# Patient Record
Sex: Male | Born: 1955 | Race: White | Hispanic: No | Marital: Married | Smoking: Former smoker
Health system: Southern US, Community
[De-identification: ages and names within clinical notes are randomized; demographics above are authoritative.]

## PROBLEM LIST (undated history)

## (undated) DIAGNOSIS — E119 Type 2 diabetes mellitus without complications: Secondary | ICD-10-CM

## (undated) DIAGNOSIS — I10 Essential (primary) hypertension: Secondary | ICD-10-CM

## (undated) DIAGNOSIS — E7800 Pure hypercholesterolemia, unspecified: Secondary | ICD-10-CM

## (undated) DIAGNOSIS — I2510 Atherosclerotic heart disease of native coronary artery without angina pectoris: Secondary | ICD-10-CM

## (undated) DIAGNOSIS — F419 Anxiety disorder, unspecified: Secondary | ICD-10-CM

## (undated) HISTORY — PX: LEG SURGERY: SHX1003

## (undated) HISTORY — DX: Anxiety disorder, unspecified: F41.9

## (undated) HISTORY — DX: Atherosclerotic heart disease of native coronary artery without angina pectoris: I25.10

## (undated) HISTORY — DX: Pure hypercholesterolemia, unspecified: E78.00

## (undated) HISTORY — DX: Type 2 diabetes mellitus without complications: E11.9

## (undated) HISTORY — DX: Essential (primary) hypertension: I10

---

## 2001-07-05 ENCOUNTER — Encounter: Payer: Self-pay | Admitting: Family Medicine

## 2001-07-05 ENCOUNTER — Ambulatory Visit (HOSPITAL_COMMUNITY): Admission: RE | Admit: 2001-07-05 | Discharge: 2001-07-05 | Payer: Self-pay | Admitting: Family Medicine

## 2011-04-13 ENCOUNTER — Emergency Department: Payer: Self-pay | Admitting: Unknown Physician Specialty

## 2016-06-23 DIAGNOSIS — R319 Hematuria, unspecified: Secondary | ICD-10-CM | POA: Diagnosis not present

## 2016-06-23 DIAGNOSIS — R32 Unspecified urinary incontinence: Secondary | ICD-10-CM | POA: Diagnosis not present

## 2016-06-23 DIAGNOSIS — R7303 Prediabetes: Secondary | ICD-10-CM | POA: Diagnosis not present

## 2016-06-23 DIAGNOSIS — F1010 Alcohol abuse, uncomplicated: Secondary | ICD-10-CM | POA: Diagnosis not present

## 2016-07-07 DIAGNOSIS — F419 Anxiety disorder, unspecified: Secondary | ICD-10-CM | POA: Diagnosis not present

## 2016-07-07 DIAGNOSIS — Z125 Encounter for screening for malignant neoplasm of prostate: Secondary | ICD-10-CM | POA: Diagnosis not present

## 2016-07-07 DIAGNOSIS — Z23 Encounter for immunization: Secondary | ICD-10-CM | POA: Diagnosis not present

## 2016-07-07 DIAGNOSIS — E785 Hyperlipidemia, unspecified: Secondary | ICD-10-CM | POA: Diagnosis not present

## 2016-07-07 DIAGNOSIS — R7303 Prediabetes: Secondary | ICD-10-CM | POA: Diagnosis not present

## 2016-07-07 DIAGNOSIS — I10 Essential (primary) hypertension: Secondary | ICD-10-CM | POA: Diagnosis not present

## 2016-08-04 DIAGNOSIS — E119 Type 2 diabetes mellitus without complications: Secondary | ICD-10-CM | POA: Diagnosis not present

## 2016-08-04 DIAGNOSIS — R809 Proteinuria, unspecified: Secondary | ICD-10-CM | POA: Diagnosis not present

## 2016-08-04 DIAGNOSIS — Z23 Encounter for immunization: Secondary | ICD-10-CM | POA: Diagnosis not present

## 2016-10-06 DIAGNOSIS — R972 Elevated prostate specific antigen [PSA]: Secondary | ICD-10-CM | POA: Diagnosis not present

## 2016-10-28 DIAGNOSIS — M25511 Pain in right shoulder: Secondary | ICD-10-CM | POA: Diagnosis not present

## 2016-10-31 DIAGNOSIS — M67911 Unspecified disorder of synovium and tendon, right shoulder: Secondary | ICD-10-CM | POA: Diagnosis not present

## 2017-01-05 DIAGNOSIS — E119 Type 2 diabetes mellitus without complications: Secondary | ICD-10-CM | POA: Diagnosis not present

## 2017-01-05 DIAGNOSIS — E785 Hyperlipidemia, unspecified: Secondary | ICD-10-CM | POA: Diagnosis not present

## 2017-01-05 DIAGNOSIS — I10 Essential (primary) hypertension: Secondary | ICD-10-CM | POA: Diagnosis not present

## 2017-01-05 DIAGNOSIS — R809 Proteinuria, unspecified: Secondary | ICD-10-CM | POA: Diagnosis not present

## 2017-06-14 DIAGNOSIS — J209 Acute bronchitis, unspecified: Secondary | ICD-10-CM | POA: Diagnosis not present

## 2017-08-31 DIAGNOSIS — E119 Type 2 diabetes mellitus without complications: Secondary | ICD-10-CM | POA: Diagnosis not present

## 2017-08-31 DIAGNOSIS — I10 Essential (primary) hypertension: Secondary | ICD-10-CM | POA: Diagnosis not present

## 2017-08-31 DIAGNOSIS — E785 Hyperlipidemia, unspecified: Secondary | ICD-10-CM | POA: Diagnosis not present

## 2017-08-31 DIAGNOSIS — M25572 Pain in left ankle and joints of left foot: Secondary | ICD-10-CM | POA: Diagnosis not present

## 2017-09-03 ENCOUNTER — Ambulatory Visit (INDEPENDENT_AMBULATORY_CARE_PROVIDER_SITE_OTHER): Payer: BLUE CROSS/BLUE SHIELD

## 2017-09-03 ENCOUNTER — Ambulatory Visit (INDEPENDENT_AMBULATORY_CARE_PROVIDER_SITE_OTHER): Payer: BLUE CROSS/BLUE SHIELD | Admitting: Orthopedic Surgery

## 2017-09-03 ENCOUNTER — Encounter (INDEPENDENT_AMBULATORY_CARE_PROVIDER_SITE_OTHER): Payer: Self-pay | Admitting: Orthopedic Surgery

## 2017-09-03 DIAGNOSIS — M19172 Post-traumatic osteoarthritis, left ankle and foot: Secondary | ICD-10-CM | POA: Diagnosis not present

## 2017-09-03 DIAGNOSIS — M25572 Pain in left ankle and joints of left foot: Secondary | ICD-10-CM | POA: Diagnosis not present

## 2017-09-03 NOTE — Progress Notes (Signed)
Office Visit Note   Patient: Ian Blankenship           Date of Birth: May 30, 1955           MRN: [id number] Visit Date: 09/03/2017              Requested by: Laurann Montana, MD [phone] [address] [place], [place] [address] PCP: Laurann Montana, MD  Chief Complaint  Patient presents with  . Left Ankle - Pain      HPI: Patient is a 62 year old gentleman with chronic traumatic osteoarthritis left ankle.  Patient states he has had significant symptoms for 2 years worse with walking he states it feels better with wearing a brace.  He uses anti-inflammatories.  Patient states that he has smoked all his life but currently is not smoking.  Patient states he has had multiple traumatic injuries with fractures and soft tissue injuries to the left ankle.  Assessment & Plan: Visit Diagnoses:  1. Pain in left ankle and joints of left foot   2. Post-traumatic osteoarthritis, left ankle and foot     Plan: Discussed with patient his options are a total ankle arthroplasty versus ankle fusion.  With patient having elevated BMI and a high demand person he most likely would do better with a fusion.  Discussed that with a total ankle arthroplasty he would have increased risk of wear and tear of the total ankle and potential for revision to a fusion.  Patient states he would like to consider a fusion at some time discussed that we would proceed with arthroscopic debridement and cannulated screw fixation.  Discussed that he would be office foot for a month and in a fracture boot for 2 months.  Follow-Up Instructions: Return if symptoms worsen or fail to improve.   Ortho Exam  Patient is alert, oriented, no adenopathy, well-dressed, normal affect, normal respiratory effort. Examination patient has an antalgic gait he has a good dorsalis pedis pulse he has multiple wounds around the ankle that have healed.  He has essentially no range of motion of the ankle he does have minimal subtalar motion.   He is tender to palpation anteriorly over the ankle his foot is plantigrade.  Imaging: Xr Ankle Complete Left  Result Date: 09/03/2017 3 view radiographs of the right ankle shows complete collapse of the tibiotalar joint with osteophytic bone spurs.  AP view shows a old Weber C fibular fracture that was treated closed.  There is a congruent subtalar joint.  No images are attached to the encounter.  Labs: No results found for: HGBA1C, ESRSEDRATE, CRP, LABURIC, REPTSTATUS, GRAMSTAIN, CULT, LABORGA  @LABSALLVALUES (HGBA1)@  There is no height or weight on file to calculate BMI.  Orders:  Orders Placed This Encounter  Procedures  . XR Ankle Complete Left   No orders of the defined types were placed in this encounter.    Procedures: No procedures performed  Clinical Data: No additional findings.  ROS:  All other systems negative, except as noted in the HPI. Review of Systems  Objective: Vital Signs: There were no vitals taken for this visit.  Specialty Comments:  No specialty comments available.  PMFS History: Patient Active Problem List   Diagnosis Date Noted  . Post-traumatic osteoarthritis, left ankle and foot 09/03/2017   Past Medical History:  Diagnosis Date  . Anxiety   . High blood pressure   . High cholesterol     Family History  Problem Relation Age of Onset  . Diabetes  Mother     History reviewed. No pertinent surgical history. Social History   Occupational History  . Not on file  Tobacco Use  . Smoking status: Never Smoker  . Smokeless tobacco: Never Used  Substance and Sexual Activity  . Alcohol use: Yes    Alcohol/week: 1.2 - 1.8 oz    Types: 2 - 3 Cans of beer per week    Comment: daily  . Drug use: Never  . Sexual activity: Not on file

## 2017-10-26 DIAGNOSIS — Z1211 Encounter for screening for malignant neoplasm of colon: Secondary | ICD-10-CM | POA: Diagnosis not present

## 2017-10-26 DIAGNOSIS — K635 Polyp of colon: Secondary | ICD-10-CM | POA: Diagnosis not present

## 2017-10-28 DIAGNOSIS — Z1211 Encounter for screening for malignant neoplasm of colon: Secondary | ICD-10-CM | POA: Diagnosis not present

## 2017-10-28 DIAGNOSIS — K635 Polyp of colon: Secondary | ICD-10-CM | POA: Diagnosis not present

## 2017-11-23 DIAGNOSIS — E785 Hyperlipidemia, unspecified: Secondary | ICD-10-CM | POA: Diagnosis not present

## 2018-01-04 DIAGNOSIS — E119 Type 2 diabetes mellitus without complications: Secondary | ICD-10-CM | POA: Diagnosis not present

## 2018-01-04 DIAGNOSIS — E785 Hyperlipidemia, unspecified: Secondary | ICD-10-CM | POA: Diagnosis not present

## 2018-01-04 DIAGNOSIS — I10 Essential (primary) hypertension: Secondary | ICD-10-CM | POA: Diagnosis not present

## 2018-01-04 DIAGNOSIS — Z125 Encounter for screening for malignant neoplasm of prostate: Secondary | ICD-10-CM | POA: Diagnosis not present

## 2018-01-21 DIAGNOSIS — M7021 Olecranon bursitis, right elbow: Secondary | ICD-10-CM | POA: Diagnosis not present

## 2018-07-30 DIAGNOSIS — I10 Essential (primary) hypertension: Secondary | ICD-10-CM | POA: Diagnosis not present

## 2018-07-30 DIAGNOSIS — E1169 Type 2 diabetes mellitus with other specified complication: Secondary | ICD-10-CM | POA: Diagnosis not present

## 2018-07-30 DIAGNOSIS — E782 Mixed hyperlipidemia: Secondary | ICD-10-CM | POA: Diagnosis not present

## 2018-07-30 DIAGNOSIS — R81 Glycosuria: Secondary | ICD-10-CM | POA: Diagnosis not present

## 2018-07-30 DIAGNOSIS — R350 Frequency of micturition: Secondary | ICD-10-CM | POA: Diagnosis not present

## 2018-08-02 DIAGNOSIS — E875 Hyperkalemia: Secondary | ICD-10-CM | POA: Diagnosis not present

## 2018-08-09 DIAGNOSIS — N289 Disorder of kidney and ureter, unspecified: Secondary | ICD-10-CM | POA: Diagnosis not present

## 2018-08-30 DIAGNOSIS — E785 Hyperlipidemia, unspecified: Secondary | ICD-10-CM | POA: Diagnosis not present

## 2018-08-30 DIAGNOSIS — N289 Disorder of kidney and ureter, unspecified: Secondary | ICD-10-CM | POA: Diagnosis not present

## 2018-08-31 DIAGNOSIS — E785 Hyperlipidemia, unspecified: Secondary | ICD-10-CM | POA: Diagnosis not present

## 2018-08-31 DIAGNOSIS — I10 Essential (primary) hypertension: Secondary | ICD-10-CM | POA: Diagnosis not present

## 2018-08-31 DIAGNOSIS — F419 Anxiety disorder, unspecified: Secondary | ICD-10-CM | POA: Diagnosis not present

## 2018-08-31 DIAGNOSIS — E1169 Type 2 diabetes mellitus with other specified complication: Secondary | ICD-10-CM | POA: Diagnosis not present

## 2018-12-01 DIAGNOSIS — E785 Hyperlipidemia, unspecified: Secondary | ICD-10-CM | POA: Diagnosis not present

## 2018-12-01 DIAGNOSIS — I10 Essential (primary) hypertension: Secondary | ICD-10-CM | POA: Diagnosis not present

## 2018-12-01 DIAGNOSIS — E1169 Type 2 diabetes mellitus with other specified complication: Secondary | ICD-10-CM | POA: Diagnosis not present

## 2018-12-01 DIAGNOSIS — F419 Anxiety disorder, unspecified: Secondary | ICD-10-CM | POA: Diagnosis not present

## 2019-03-31 ENCOUNTER — Other Ambulatory Visit: Payer: Self-pay

## 2019-03-31 DIAGNOSIS — Z20822 Contact with and (suspected) exposure to covid-19: Secondary | ICD-10-CM

## 2019-04-01 LAB — NOVEL CORONAVIRUS, NAA: SARS-CoV-2, NAA: NOT DETECTED

## 2019-08-13 DIAGNOSIS — Z23 Encounter for immunization: Secondary | ICD-10-CM | POA: Diagnosis not present

## 2019-09-05 DIAGNOSIS — M5416 Radiculopathy, lumbar region: Secondary | ICD-10-CM | POA: Diagnosis not present

## 2019-09-05 DIAGNOSIS — M545 Low back pain: Secondary | ICD-10-CM | POA: Diagnosis not present

## 2019-09-05 DIAGNOSIS — M62830 Muscle spasm of back: Secondary | ICD-10-CM | POA: Diagnosis not present

## 2019-09-05 DIAGNOSIS — M9903 Segmental and somatic dysfunction of lumbar region: Secondary | ICD-10-CM | POA: Diagnosis not present

## 2019-09-07 DIAGNOSIS — M62830 Muscle spasm of back: Secondary | ICD-10-CM | POA: Diagnosis not present

## 2019-09-07 DIAGNOSIS — M9903 Segmental and somatic dysfunction of lumbar region: Secondary | ICD-10-CM | POA: Diagnosis not present

## 2019-09-07 DIAGNOSIS — M5416 Radiculopathy, lumbar region: Secondary | ICD-10-CM | POA: Diagnosis not present

## 2019-09-07 DIAGNOSIS — M545 Low back pain: Secondary | ICD-10-CM | POA: Diagnosis not present

## 2019-09-09 DIAGNOSIS — M545 Low back pain: Secondary | ICD-10-CM | POA: Diagnosis not present

## 2019-09-09 DIAGNOSIS — M62830 Muscle spasm of back: Secondary | ICD-10-CM | POA: Diagnosis not present

## 2019-09-09 DIAGNOSIS — M5416 Radiculopathy, lumbar region: Secondary | ICD-10-CM | POA: Diagnosis not present

## 2019-09-09 DIAGNOSIS — M9903 Segmental and somatic dysfunction of lumbar region: Secondary | ICD-10-CM | POA: Diagnosis not present

## 2019-09-10 DIAGNOSIS — Z23 Encounter for immunization: Secondary | ICD-10-CM | POA: Diagnosis not present

## 2019-09-12 DIAGNOSIS — I10 Essential (primary) hypertension: Secondary | ICD-10-CM | POA: Diagnosis not present

## 2019-09-12 DIAGNOSIS — M9903 Segmental and somatic dysfunction of lumbar region: Secondary | ICD-10-CM | POA: Diagnosis not present

## 2019-09-12 DIAGNOSIS — M5416 Radiculopathy, lumbar region: Secondary | ICD-10-CM | POA: Diagnosis not present

## 2019-09-12 DIAGNOSIS — E1169 Type 2 diabetes mellitus with other specified complication: Secondary | ICD-10-CM | POA: Diagnosis not present

## 2019-09-12 DIAGNOSIS — Z125 Encounter for screening for malignant neoplasm of prostate: Secondary | ICD-10-CM | POA: Diagnosis not present

## 2019-09-12 DIAGNOSIS — M62830 Muscle spasm of back: Secondary | ICD-10-CM | POA: Diagnosis not present

## 2019-09-12 DIAGNOSIS — M109 Gout, unspecified: Secondary | ICD-10-CM | POA: Diagnosis not present

## 2019-09-12 DIAGNOSIS — E785 Hyperlipidemia, unspecified: Secondary | ICD-10-CM | POA: Diagnosis not present

## 2019-09-12 DIAGNOSIS — M545 Low back pain: Secondary | ICD-10-CM | POA: Diagnosis not present

## 2019-09-14 ENCOUNTER — Other Ambulatory Visit: Payer: Self-pay | Admitting: Family Medicine

## 2019-09-14 DIAGNOSIS — F17201 Nicotine dependence, unspecified, in remission: Secondary | ICD-10-CM

## 2019-09-19 DIAGNOSIS — M9903 Segmental and somatic dysfunction of lumbar region: Secondary | ICD-10-CM | POA: Diagnosis not present

## 2019-09-19 DIAGNOSIS — M545 Low back pain: Secondary | ICD-10-CM | POA: Diagnosis not present

## 2019-09-19 DIAGNOSIS — M5416 Radiculopathy, lumbar region: Secondary | ICD-10-CM | POA: Diagnosis not present

## 2019-09-19 DIAGNOSIS — M62830 Muscle spasm of back: Secondary | ICD-10-CM | POA: Diagnosis not present

## 2019-09-21 DIAGNOSIS — R809 Proteinuria, unspecified: Secondary | ICD-10-CM | POA: Diagnosis not present

## 2019-09-22 DIAGNOSIS — M5416 Radiculopathy, lumbar region: Secondary | ICD-10-CM | POA: Diagnosis not present

## 2019-09-22 DIAGNOSIS — M9903 Segmental and somatic dysfunction of lumbar region: Secondary | ICD-10-CM | POA: Diagnosis not present

## 2019-09-22 DIAGNOSIS — M545 Low back pain: Secondary | ICD-10-CM | POA: Diagnosis not present

## 2019-09-22 DIAGNOSIS — M62830 Muscle spasm of back: Secondary | ICD-10-CM | POA: Diagnosis not present

## 2019-11-21 DIAGNOSIS — R809 Proteinuria, unspecified: Secondary | ICD-10-CM | POA: Diagnosis not present

## 2019-11-21 DIAGNOSIS — I129 Hypertensive chronic kidney disease with stage 1 through stage 4 chronic kidney disease, or unspecified chronic kidney disease: Secondary | ICD-10-CM | POA: Diagnosis not present

## 2019-12-27 ENCOUNTER — Other Ambulatory Visit: Payer: Self-pay | Admitting: Family Medicine

## 2019-12-27 DIAGNOSIS — F17201 Nicotine dependence, unspecified, in remission: Secondary | ICD-10-CM

## 2019-12-29 ENCOUNTER — Ambulatory Visit
Admission: RE | Admit: 2019-12-29 | Discharge: 2019-12-29 | Disposition: A | Discharge status: Home / Self Care | Payer: BC Managed Care – PPO | Source: Ambulatory Visit | Attending: Family Medicine | Admitting: Family Medicine

## 2019-12-29 DIAGNOSIS — F17201 Nicotine dependence, unspecified, in remission: Secondary | ICD-10-CM

## 2019-12-29 DIAGNOSIS — Z87891 Personal history of nicotine dependence: Secondary | ICD-10-CM | POA: Diagnosis not present

## 2020-02-08 ENCOUNTER — Ambulatory Visit: Payer: BC Managed Care – PPO | Admitting: Cardiovascular Disease

## 2020-02-27 NOTE — Progress Notes (Signed)
Cardiology Office Note:   Date:  03/01/2020  NAME:  Ian Blankenship    MRN: [id number] DOB:  1955-09-22   PCP:  Laurann Montana, MD  Cardiologist:  No primary care provider on file.   Referring MD: Laurann Montana, MD   Chief Complaint  Patient presents with  . Coronary Artery Disease   History of Present Illness:   Ian Blankenship is a 64 y.o. male with a hx of CAD, HTN, HLD who is being seen today for the evaluation of CAD at the request of Laurann Montana, MD.  He reports he has no symptoms.  He does not exercise routinely.  He reports he has significant arthritis in his knees.  With his current level of activity which includes walking he has no limitations such as chest pain or shortness of breath or palpitations.  He does have nearly a 30-pack-year history.  He quit smoking about 7 years ago.  Blood pressures well controlled on lisinopril.  His most recent lipid profile shows a total cholesterol 163, HDL 38, LDL 92, triglycerides 190.  He is diabetic on Metformin with a recent A1c of 6.3.  He works in Airline pilot.  His EKG today is normal without any ischemic changes.  No strong family history of heart disease.  He does drink alcohol daily in moderation.  No illicit drug use reported.  He is a father of 2 daughters.  He is married.  He denies any chest pain, shortness of breath or palpitations in office today.  He does report he likely has sleep apnea.  He reports he snores frequently and his fatigue during the day.  Has never had a sleep study.  He is interested in completing a home study.  Problem List 1. CAD -coronary calcification on CT chest lung CA screening -minimal calcifications on my read  2. HTN 3. HLD -total cholesterol 163, HDL 38, LDL 92, triglycerides 190 4. OSA 5.  Diabetes -A1c 6.3  Past Medical History: Past Medical History:  Diagnosis Date  . Anxiety   . Coronary artery disease   . Diabetes mellitus without complication (HCC)   . High blood pressure   . High  cholesterol     Past Surgical History: Past Surgical History:  Procedure Laterality Date  . LEG SURGERY      Current Medications: Current Meds  Medication Sig  . ALPRAZolam (XANAX) 0.5 MG tablet   . atorvastatin (LIPITOR) 80 MG tablet Take 1 tablet (80 mg total) by mouth daily.  . indomethacin (INDOCIN) 50 MG capsule   . lisinopril (PRINIVIL,ZESTRIL) 20 MG tablet   . metFORMIN (GLUCOPHAGE-XR) 500 MG 24 hr tablet SMARTSIG:4 Tablet(s) By Mouth Every Evening  . [DISCONTINUED] atorvastatin (LIPITOR) 40 MG tablet      Allergies:    Patient has no allergy information on record.   Social History: Social History   Socioeconomic History  . Marital status: Married    Spouse name: Not on file  . Number of children: 2  . Years of education: Not on file  . Highest education level: Not on file  Occupational History  . Occupation: Airline pilot   Tobacco Use  . Smoking status: Former Smoker    Packs/day: 1.00    Years: 30.00    Pack years: 30.00  . Smokeless tobacco: Never Used  Substance and Sexual Activity  . Alcohol use: Yes    Alcohol/week: 2.0 - 3.0 standard drinks    Types: 2 - 3 Cans of beer per week  Comment: daily  . Drug use: Never  . Sexual activity: Not on file  Other Topics Concern  . Not on file  Social History Narrative  . Not on file   Social Determinants of Health   Financial Resource Strain:   . Difficulty of Paying Living Expenses: Not on file  Food Insecurity:   . Worried About Programme researcher, broadcasting/film/video in the Last Year: Not on file  . Ran Out of Food in the Last Year: Not on file  Transportation Needs:   . Lack of Transportation (Medical): Not on file  . Lack of Transportation (Non-Medical): Not on file  Physical Activity:   . Days of Exercise per Week: Not on file  . Minutes of Exercise per Session: Not on file  Stress:   . Feeling of Stress : Not on file  Social Connections:   . Frequency of Communication with Friends and Family: Not on file  .  Frequency of Social Gatherings with Friends and Family: Not on file  . Attends Religious Services: Not on file  . Active Member of Clubs or Organizations: Not on file  . Attends Banker Meetings: Not on file  . Marital Status: Not on file     Family History: The patient's family history includes Diabetes in his mother.  ROS:   All other ROS reviewed and negative. Pertinent positives noted in the HPI.     EKGs/Labs/Other Studies Reviewed:   The following studies were personally reviewed by me today:  EKG:  EKG is ordered today.  The ekg ordered today demonstrates normal sinus rhythm, heart rate 85, no acute ischemic changes, no evidence of prior infarction, and was personally reviewed by me.   Recent Labs: No results found for requested labs within last 8760 hours.   Recent Lipid Panel No results found for: CHOL, TRIG, HDL, CHOLHDL, VLDL, LDLCALC, LDLDIRECT  Physical Exam:   VS:  BP 120/71   Pulse 85   Temp (!) 95.7 F (35.4 C)   Ht 5\' 11"  (1.803 m)   Wt 237 lb 3.2 oz (107.6 kg)   SpO2 97%   BMI 33.08 kg/m    Wt Readings from Last 3 Encounters:  03/01/20 237 lb 3.2 oz (107.6 kg)    General: Well nourished, well developed, in no acute distress Heart: Atraumatic, normal size  Eyes: PEERLA, EOMI  Neck: Supple, no JVD Endocrine: No thryomegaly Cardiac: Normal S1, S2; RRR; no murmurs, rubs, or gallops Lungs: Clear to auscultation bilaterally, no wheezing, rhonchi or rales  Abd: Soft, nontender, no hepatomegaly  Ext: No edema, pulses 2+ Musculoskeletal: No deformities, BUE and BLE strength normal and equal Skin: Warm and dry, no rashes   Neuro: Alert and oriented to person, place, time, and situation, CNII-XII grossly intact, no focal deficits  Psych: Normal mood and affect   ASSESSMENT:   [name] is a 64 y.o. male who presents for the following: 1. Coronary artery disease involving native coronary artery of native heart without angina pectoris    2. Primary hypertension   3. Mixed hyperlipidemia   4. Obesity (BMI 30-39.9)   5. Snoring   6. Fatigue, unspecified type     PLAN:   1. Coronary artery disease involving native coronary artery of native heart without angina pectoris 2. Primary hypertension 3. Mixed hyperlipidemia -Minimal coronary calcifications on recent lung cancer screening CT.  No symptoms of angina.  Cardiovascular exam normal.  EKG is normal.  I recommend he take a  baby aspirin.  Of also recommended intensification of LDL cholesterol.  He is diabetic.  He has CAD.  LDL cholesterol goal is less than 70.  I will increase his Lipitor to 80 mg daily.  He will come back in 3 months.  1 week before he will give [place] a fasting lipid profile.  Also need to get his triglycerides less than 150.  I have encouraged him to start exercising and to eat a heart healthy regimen. -Blood pressure well controlled.  A1c 6.3 on Metformin.  4. Obesity (BMI 30-39.9) 5. Snoring 6. Fatigue, unspecified type -He reports he snores and has apneic episodes.  Home sleep study ordered.  Disposition: Return in about 3 months (around 06/01/2020).  Medication Adjustments/Labs and Tests Ordered: Current medicines are reviewed at length with the patient today.  Concerns regarding medicines are outlined above.  Orders Placed This Encounter  Procedures  . Lipid panel  . EKG 12-Lead  . Home sleep test   Meds ordered this encounter  Medications  . atorvastatin (LIPITOR) 80 MG tablet    Sig: Take 1 tablet (80 mg total) by mouth daily.    Dispense:  30 tablet    Refill:  3    Patient Instructions  Medication Instructions:  INCREASED LIPITOR TO 80mg  (1 tablet) daily *If you need a refill on your cardiac medications before your next appointment, please call your pharmacy*  Lab Work: FASTING LIPID PANEL- PLEASE COME HAVE THIS DONE 1 WEEK PRIOR TO NEXT APT If you have labs (blood work) drawn today and your tests are completely normal, you will  receive your results only by: MyChart Message (if you have MyChart) OR . A paper copy in the mail If you have any lab test that is abnormal or we need to change your treatment, we will call you to review the results.  Testing/Procedures: Your physician has recommended that you have a sleep study. This test records several body functions during sleep, including: brain activity, eye movement, oxygen and carbon dioxide blood levels, heart rate and rhythm, breathing rate and rhythm, the flow of air through your mouth and nose, snoring, body muscle movements, and chest and belly movement. SOME ONE WILL REACH OUT TO YOU TO SCHEDULE THIS   Follow-Up: At [facility], you and your health needs are our priority.  As part of our continuing mission to provide you with exceptional heart care, we have created designated Provider Care Teams.  These Care Teams include your primary Cardiologist (physician) and Advanced Practice Providers (APPs -  Physician Assistants and Nurse Practitioners) who all work together to provide you with the care you need, when you need it.  Your next appointment:   3 month(s)  The format for your next appointment:   In Person  Provider:   [facility], MD      Signed, Lennie Odor. Lenna Gilford, MD [facility]  [address] [place], [place] [phone]  03/01/2020 2:46 PM

## 2020-03-01 ENCOUNTER — Other Ambulatory Visit: Payer: Self-pay

## 2020-03-01 ENCOUNTER — Ambulatory Visit (INDEPENDENT_AMBULATORY_CARE_PROVIDER_SITE_OTHER): Payer: BC Managed Care – PPO | Admitting: Cardiovascular Disease

## 2020-03-01 ENCOUNTER — Encounter: Payer: Self-pay | Admitting: Cardiovascular Disease

## 2020-03-01 VITALS — BP 120/71 | HR 85 | Temp 95.7°F | Ht 71.0 in | Wt 237.2 lb

## 2020-03-01 DIAGNOSIS — E782 Mixed hyperlipidemia: Secondary | ICD-10-CM | POA: Diagnosis not present

## 2020-03-01 DIAGNOSIS — E669 Obesity, unspecified: Secondary | ICD-10-CM

## 2020-03-01 DIAGNOSIS — I2510 Atherosclerotic heart disease of native coronary artery without angina pectoris: Secondary | ICD-10-CM

## 2020-03-01 DIAGNOSIS — I10 Essential (primary) hypertension: Secondary | ICD-10-CM

## 2020-03-01 DIAGNOSIS — R5383 Other fatigue: Secondary | ICD-10-CM

## 2020-03-01 DIAGNOSIS — R0683 Snoring: Secondary | ICD-10-CM

## 2020-03-01 MED ORDER — ATORVASTATIN CALCIUM 80 MG PO TABS: 80.0000 mg | ORAL_TABLET | Freq: Every day | ORAL | 3 refills | Status: DC

## 2020-03-01 NOTE — Patient Instructions (Signed)
Medication Instructions:  INCREASED LIPITOR TO 80mg  (1 tablet) daily *If you need a refill on your cardiac medications before your next appointment, please call your pharmacy*  Lab Work: FASTING LIPID PANEL- PLEASE COME HAVE THIS DONE 1 WEEK PRIOR TO NEXT APT If you have labs (blood work) drawn today and your tests are completely normal, you will receive your results only by: MyChart Message (if you have MyChart) OR . A paper copy in the mail If you have any lab test that is abnormal or we need to change your treatment, we will call you to review the results.  Testing/Procedures: Your physician has recommended that you have a sleep study. This test records several body functions during sleep, including: brain activity, eye movement, oxygen and carbon dioxide blood levels, heart rate and rhythm, breathing rate and rhythm, the flow of air through your mouth and nose, snoring, body muscle movements, and chest and belly movement. SOME ONE WILL REACH OUT TO YOU TO SCHEDULE THIS   Follow-Up: At [facility], you and your health needs are our priority.  As part of our continuing mission to provide you with exceptional heart care, we have created designated Provider Care Teams.  These Care Teams include your primary Cardiologist (physician) and Advanced Practice Providers (APPs -  Physician Assistants and Nurse Practitioners) who all work together to provide you with the care you need, when you need it.  Your next appointment:   3 month(s)  The format for your next appointment:   In Person  Provider:   [facility], MD

## 2020-03-29 DIAGNOSIS — M62830 Muscle spasm of back: Secondary | ICD-10-CM | POA: Diagnosis not present

## 2020-03-29 DIAGNOSIS — M9903 Segmental and somatic dysfunction of lumbar region: Secondary | ICD-10-CM | POA: Diagnosis not present

## 2020-03-29 DIAGNOSIS — M545 Low back pain, unspecified: Secondary | ICD-10-CM | POA: Diagnosis not present

## 2020-03-29 DIAGNOSIS — M5416 Radiculopathy, lumbar region: Secondary | ICD-10-CM | POA: Diagnosis not present

## 2020-04-02 DIAGNOSIS — Z23 Encounter for immunization: Secondary | ICD-10-CM | POA: Diagnosis not present

## 2020-04-02 DIAGNOSIS — R809 Proteinuria, unspecified: Secondary | ICD-10-CM | POA: Diagnosis not present

## 2020-04-02 DIAGNOSIS — I129 Hypertensive chronic kidney disease with stage 1 through stage 4 chronic kidney disease, or unspecified chronic kidney disease: Secondary | ICD-10-CM | POA: Diagnosis not present

## 2020-04-16 DIAGNOSIS — M9903 Segmental and somatic dysfunction of lumbar region: Secondary | ICD-10-CM | POA: Diagnosis not present

## 2020-04-16 DIAGNOSIS — M62830 Muscle spasm of back: Secondary | ICD-10-CM | POA: Diagnosis not present

## 2020-04-16 DIAGNOSIS — M545 Low back pain, unspecified: Secondary | ICD-10-CM | POA: Diagnosis not present

## 2020-04-16 DIAGNOSIS — M5416 Radiculopathy, lumbar region: Secondary | ICD-10-CM | POA: Diagnosis not present

## 2020-06-03 NOTE — Progress Notes (Signed)
Cardiology Office Note:   Date:  06/04/2020  NAME:  JACOBY ZANNI    MRN: [id number] DOB:  1955/06/12   PCP:  Laurann Montana, MD  Cardiologist:  No primary care provider on file.   Referring MD: Laurann Montana, MD   Chief Complaint  Patient presents with   Coronary Artery Disease   History of Present Illness:   CHEY CHO is a 65 y.o. male with a hx of CAD (coronary calcifications on lung CA screening CT), HTN, HLD who presents for follow-up. Plan to repeat lipid profile.  He reports he is doing well.  No structured exercise due to arthritis in his knees.  He was instructed to stop taking indomethacin.  Likely okay to take periodically.  Blood pressure 125/74.  He is on lisinopril.  Most recent diabetes number acceptable.  He is due for repeat labs.  He has been on Lipitor 80 mg.  We do need to recheck his lipid profile today.  He denies any chest pain, shortness of breath or palpitations today.  He is without any major symptoms.  Overall doing well.  Just need to make sure his cholesterol level is at goal.  Problem List 1. CAD -coronary calcification on CT chest lung CA screening -minimal calcifications on my read  2. HTN 3. HLD -total cholesterol 163, HDL 38, LDL 92, triglycerides 190 4. OSA 5.  Diabetes -A1c 6.3  Past Medical History: Past Medical History:  Diagnosis Date   Anxiety    Coronary artery disease    Diabetes mellitus without complication (HCC)    High blood pressure    High cholesterol     Past Surgical History: Past Surgical History:  Procedure Laterality Date   LEG SURGERY      Current Medications: Current Meds  Medication Sig   ALPRAZolam (XANAX) 0.5 MG tablet    lisinopril (PRINIVIL,ZESTRIL) 20 MG tablet    metFORMIN (GLUCOPHAGE-XR) 500 MG 24 hr tablet SMARTSIG:4 Tablet(s) By Mouth Every Evening   [DISCONTINUED] atorvastatin (LIPITOR) 80 MG tablet Take 1 tablet (80 mg total) by mouth daily.   [DISCONTINUED] indomethacin  (INDOCIN) 50 MG capsule      Allergies:    Patient has no allergy information on record.   Social History: Social History   Socioeconomic History   Marital status: Married    Spouse name: Not on file   Number of children: 2   Years of education: Not on file   Highest education level: Not on file  Occupational History   Occupation: sales   Tobacco Use   Smoking status: Former Smoker    Packs/day: 1.00    Years: 30.00    Pack years: 30.00   Smokeless tobacco: Never Used  Substance and Sexual Activity   Alcohol use: Yes    Alcohol/week: 2.0 - 3.0 standard drinks    Types: 2 - 3 Cans of beer per week    Comment: daily   Drug use: Never   Sexual activity: Not on file  Other Topics Concern   Not on file  Social History Narrative   Not on file   Social Determinants of Health   Financial Resource Strain: Not on file  Food Insecurity: Not on file  Transportation Needs: Not on file  Physical Activity: Not on file  Stress: Not on file  Social Connections: Not on file     Family History: The patient's family history includes Diabetes in his mother.  ROS:   All other ROS reviewed and  negative. Pertinent positives noted in the HPI.     EKGs/Labs/Other Studies Reviewed:   The following studies were personally reviewed by me today:   Recent Labs: No results found for requested labs within last 8760 hours.   Recent Lipid Panel No results found for: CHOL, TRIG, HDL, CHOLHDL, VLDL, LDLCALC, LDLDIRECT  Physical Exam:   VS:  BP 125/74    Pulse 71    Temp (!) 97.3 F (36.3 C)    Ht 5\' 10"  (1.778 m)    Wt 236 lb (107 kg)    SpO2 98%    BMI 33.86 kg/m    Wt Readings from Last 3 Encounters:  06/04/20 236 lb (107 kg)  03/01/20 237 lb 3.2 oz (107.6 kg)    General: Well nourished, well developed, in no acute distress Head: Atraumatic, normal size  Eyes: PEERLA, EOMI  Neck: Supple, no JVD Endocrine: No thryomegaly Cardiac: Normal S1, S2; RRR; no murmurs, rubs,  or gallops Lungs: Clear to auscultation bilaterally, no wheezing, rhonchi or rales  Abd: Soft, nontender, no hepatomegaly  Ext: No edema, pulses 2+ Musculoskeletal: No deformities, BUE and BLE strength normal and equal Skin: Warm and dry, no rashes   Neuro: Alert and oriented to person, place, time, and situation, CNII-XII grossly intact, no focal deficits  Psych: Normal mood and affect   ASSESSMENT:   NARESH ALTHAUS is a 65 y.o. male who presents for the following: 1. Coronary artery disease involving native coronary artery of native heart without angina pectoris   2. Mixed hyperlipidemia   3. Primary hypertension   4. Obesity (BMI 30-39.9)   5. Arthritis   6. Type 2 diabetes mellitus with complication, without long-term current use of insulin (HCC)     PLAN:   1. Coronary artery disease involving native coronary artery of native heart without angina pectoris 2. Mixed hyperlipidemia -Minimal coronary calcification seen on lung cancer screening CT.  Continue Lipitor 80 mg a day.  Repeat lipid profile today.  Goal LDL cholesterol less than 70.  I would not worry about aspirin for now.  He has minimal CAD.  3. Primary hypertension -BP acceptable.  Continue lisinopril.  4. Obesity (BMI 30-39.9) -Diet and exercise recommended.  5. Arthritis -We will give him a prescription for indomethacin.  He will take this periodically.  30 tablets no refills.  6. Type 2 diabetes mellitus with complication, without long-term current use of insulin (HCC) -Recheck A1c.  Disposition: Return in about 1 year (around 06/04/2021).  Medication Adjustments/Labs and Tests Ordered: Current medicines are reviewed at length with the patient today.  Concerns regarding medicines are outlined above.  Orders Placed This Encounter  Procedures   CBC   Basic metabolic panel   Hemoglobin A1c   Lipid panel   Meds ordered this encounter  Medications   indomethacin (INDOCIN) 50 MG capsule    Sig: Take  1 capsule (50 mg total) by mouth 2 (two) times daily with a meal.    Dispense:  30 capsule    Refill:  0    Future refills, if needed, from PCP   atorvastatin (LIPITOR) 80 MG tablet    Sig: Take 1 tablet (80 mg total) by mouth daily.    Dispense:  90 tablet    Refill:  3    Patient Instructions  Medication Instructions:  Your physician recommends that you continue on your current medications as directed. Please refer to the Current Medication list given to you today.  *If you  need a refill on your cardiac medications before your next appointment, please call your pharmacy*   Lab Work: Lipid Panel, A1c, BMET, CBC If you have labs (blood work) drawn today and your tests are completely normal, you will receive your results only by:  MyChart Message (if you have MyChart) OR  A paper copy in the mail If you have any lab test that is abnormal or we need to change your treatment, we will call you to review the results.   Testing/Procedures: NONE   Follow-Up: At [facility], you and your health needs are our priority.  As part of our continuing mission to provide you with exceptional heart care, we have created designated Provider Care Teams.  These Care Teams include your primary Cardiologist (physician) and Advanced Practice Providers (APPs -  Physician Assistants and Nurse Practitioners) who all work together to provide you with the care you need, when you need it.  We recommend signing up for the patient portal called "MyChart".  Sign up information is provided on this After Visit Summary.  MyChart is used to connect with patients for Virtual Visits (Telemedicine).  Patients are able to view lab/test results, encounter notes, upcoming appointments, etc.  Non-urgent messages can be sent to your provider as well.   To learn more about what you can do with MyChart, go to [email or web address].    Your next appointment:   12 month(s)  The format for your next appointment:   In  Person  Provider:   You may see Dr. Flora Lipps or one of the following Advanced Practice Providers on your designated Care Team:    Azalee Course, PA-C  Micah Flesher, [place] or   Judy Pimple, [place]    Other Instructions      Time Spent with Patient: I have spent a total of 35 minutes with patient reviewing hospital notes, telemetry, EKGs, labs and examining the patient as well as establishing an assessment and plan that was discussed with the patient.  > 50% of time was spent in direct patient care.  Signed, Lenna Gilford. Flora Lipps, MD [facility]  [address], [address] [place], [place] [address] [phone]  06/04/2020 1:36 PM

## 2020-06-04 ENCOUNTER — Other Ambulatory Visit: Payer: Self-pay

## 2020-06-04 ENCOUNTER — Encounter: Payer: Self-pay | Admitting: Cardiovascular Disease

## 2020-06-04 ENCOUNTER — Ambulatory Visit (INDEPENDENT_AMBULATORY_CARE_PROVIDER_SITE_OTHER): Payer: BC Managed Care – PPO | Admitting: Cardiovascular Disease

## 2020-06-04 VITALS — BP 125/74 | HR 71 | Temp 97.3°F | Ht 70.0 in | Wt 236.0 lb

## 2020-06-04 DIAGNOSIS — E669 Obesity, unspecified: Secondary | ICD-10-CM

## 2020-06-04 DIAGNOSIS — E782 Mixed hyperlipidemia: Secondary | ICD-10-CM | POA: Diagnosis not present

## 2020-06-04 DIAGNOSIS — I10 Essential (primary) hypertension: Secondary | ICD-10-CM | POA: Diagnosis not present

## 2020-06-04 DIAGNOSIS — I2510 Atherosclerotic heart disease of native coronary artery without angina pectoris: Secondary | ICD-10-CM

## 2020-06-04 DIAGNOSIS — M1990 Unspecified osteoarthritis, unspecified site: Secondary | ICD-10-CM

## 2020-06-04 DIAGNOSIS — E118 Type 2 diabetes mellitus with unspecified complications: Secondary | ICD-10-CM

## 2020-06-04 MED ORDER — INDOMETHACIN 50 MG PO CAPS: 50.0000 mg | ORAL_CAPSULE | Freq: Two times a day (BID) | ORAL | 0 refills | Status: AC

## 2020-06-04 MED ORDER — ATORVASTATIN CALCIUM 80 MG PO TABS: 80.0000 mg | ORAL_TABLET | Freq: Every day | ORAL | 3 refills | Status: DC

## 2020-06-04 NOTE — Patient Instructions (Signed)
Medication Instructions:  Your physician recommends that you continue on your current medications as directed. Please refer to the Current Medication list given to you today.  *If you need a refill on your cardiac medications before your next appointment, please call your pharmacy*   Lab Work: Lipid Panel, A1c, BMET, CBC If you have labs (blood work) drawn today and your tests are completely normal, you will receive your results only by: Marland Kitchen MyChart Message (if you have MyChart) OR . A paper copy in the mail If you have any lab test that is abnormal or we need to change your treatment, we will call you to review the results.   Testing/Procedures: NONE   Follow-Up: At [facility], you and your health needs are our priority.  As part of our continuing mission to provide you with exceptional heart care, we have created designated Provider Care Teams.  These Care Teams include your primary Cardiologist (physician) and Advanced Practice Providers (APPs -  Physician Assistants and Nurse Practitioners) who all work together to provide you with the care you need, when you need it.  We recommend signing up for the patient portal called "MyChart".  Sign up information is provided on this After Visit Summary.  MyChart is used to connect with patients for Virtual Visits (Telemedicine).  Patients are able to view lab/test results, encounter notes, upcoming appointments, etc.  Non-urgent messages can be sent to your provider as well.   To learn more about what you can do with MyChart, go to [email or web address].    Your next appointment:   12 month(s)  The format for your next appointment:   In Person  Provider:   You may see Dr. Flora Lipps or one of the following Advanced Practice Providers on your designated Care Team:    Azalee Course, PA-C  Micah Flesher, [place] or   Judy Pimple, [place]    Other Instructions

## 2020-06-05 LAB — LIPID PANEL
Chol/HDL Ratio: 5.4 ratio — ABNORMAL HIGH (ref 0.0–5.0)
Cholesterol, Total: 168 mg/dL (ref 100–199)
HDL: 31 mg/dL — ABNORMAL LOW (ref >39)
LDL Chol Calc (NIH): 92 mg/dL (ref 0–99)
Triglycerides: 264 mg/dL — ABNORMAL HIGH (ref 0–149)
VLDL Cholesterol Cal: 45 mg/dL — ABNORMAL HIGH (ref 5–40)

## 2020-06-05 LAB — BASIC METABOLIC PANEL
BUN/Creatinine Ratio: 16 (ref 10–24)
BUN: 18 mg/dL (ref 8–27)
CO2: 24 mmol/L (ref 20–29)
Calcium: 9.5 mg/dL (ref 8.6–10.2)
Chloride: 101 mmol/L (ref 96–106)
Creatinine, Ser: 1.13 mg/dL (ref 0.76–1.27)
GFR calc Af Amer: 78 mL/min/{1.73_m2} (ref >59)
GFR calc non Af Amer: 68 mL/min/{1.73_m2} (ref >59)
Glucose: 114 mg/dL — ABNORMAL HIGH (ref 65–99)
Potassium: 5.1 mmol/L (ref 3.5–5.2)
Sodium: 138 mmol/L (ref 134–144)

## 2020-06-05 LAB — CBC
Hematocrit: 43 % (ref 37.5–51.0)
Hemoglobin: 14.3 g/dL (ref 13.0–17.7)
MCH: 31.7 pg (ref 26.6–33.0)
MCHC: 33.3 g/dL (ref 31.5–35.7)
MCV: 95 fL (ref 79–97)
Platelets: 165 10*3/uL (ref 150–450)
RBC: 4.51 x10E6/uL (ref 4.14–5.80)
RDW: 12.6 % (ref 11.6–15.4)
WBC: 6.5 10*3/uL (ref 3.4–10.8)

## 2020-06-05 LAB — HEMOGLOBIN A1C
Est. average glucose Bld gHb Est-mCnc: 128 mg/dL
Hgb A1c MFr Bld: 6.1 % — ABNORMAL HIGH (ref 4.8–5.6)

## 2020-06-07 ENCOUNTER — Other Ambulatory Visit: Payer: Self-pay

## 2020-06-07 MED ORDER — EZETIMIBE 10 MG PO TABS
10.0000 mg | ORAL_TABLET | Freq: Every day | ORAL | 3 refills | Status: DC
Start: 2020-06-07 — End: 2021-06-25

## 2020-08-02 ENCOUNTER — Other Ambulatory Visit: Payer: Self-pay | Admitting: Cardiovascular Disease

## 2020-09-12 ENCOUNTER — Telehealth: Payer: Self-pay

## 2020-09-12 NOTE — Telephone Encounter (Signed)
Can you please assist with this PA request for Zetia.    Key: [id number] Name: Murdoch DOB: 05/20/1956  Thank you!

## 2020-09-12 NOTE — Telephone Encounter (Signed)
**Note De-Identified Chanel Mcadams Obfuscation** I started a Ezetimibe PA through covermymeds and received the following message: Aubrey Voong Key: [id number] - PA Case ID: [id number] - Rx #: [id number] Outcome Approved today Coverage Starts on: 09/12/2020 12:00:00 AM, Coverage Ends on: 09/12/2021 12:00:00 AM. Drug: Ezetimibe 10MG  tablets Form: PA Form (2017 NCPDP)  I have notified [facility] of this approval Alexzavier Girardin VM as well.

## 2020-09-21 DIAGNOSIS — R809 Proteinuria, unspecified: Secondary | ICD-10-CM | POA: Diagnosis not present

## 2020-09-21 DIAGNOSIS — I10 Essential (primary) hypertension: Secondary | ICD-10-CM | POA: Diagnosis not present

## 2020-09-21 DIAGNOSIS — Z23 Encounter for immunization: Secondary | ICD-10-CM | POA: Diagnosis not present

## 2020-09-21 DIAGNOSIS — Z125 Encounter for screening for malignant neoplasm of prostate: Secondary | ICD-10-CM | POA: Diagnosis not present

## 2020-09-21 DIAGNOSIS — E1169 Type 2 diabetes mellitus with other specified complication: Secondary | ICD-10-CM | POA: Diagnosis not present

## 2020-09-21 DIAGNOSIS — E785 Hyperlipidemia, unspecified: Secondary | ICD-10-CM | POA: Diagnosis not present

## 2020-09-28 DIAGNOSIS — M170 Bilateral primary osteoarthritis of knee: Secondary | ICD-10-CM | POA: Diagnosis not present

## 2020-10-17 DIAGNOSIS — M545 Low back pain, unspecified: Secondary | ICD-10-CM | POA: Diagnosis not present

## 2020-10-17 DIAGNOSIS — M5416 Radiculopathy, lumbar region: Secondary | ICD-10-CM | POA: Diagnosis not present

## 2020-10-17 DIAGNOSIS — M9903 Segmental and somatic dysfunction of lumbar region: Secondary | ICD-10-CM | POA: Diagnosis not present

## 2020-10-17 DIAGNOSIS — M62830 Muscle spasm of back: Secondary | ICD-10-CM | POA: Diagnosis not present

## 2020-10-19 DIAGNOSIS — I129 Hypertensive chronic kidney disease with stage 1 through stage 4 chronic kidney disease, or unspecified chronic kidney disease: Secondary | ICD-10-CM | POA: Diagnosis not present

## 2020-10-19 DIAGNOSIS — M62830 Muscle spasm of back: Secondary | ICD-10-CM | POA: Diagnosis not present

## 2020-10-19 DIAGNOSIS — M545 Low back pain, unspecified: Secondary | ICD-10-CM | POA: Diagnosis not present

## 2020-10-19 DIAGNOSIS — M5416 Radiculopathy, lumbar region: Secondary | ICD-10-CM | POA: Diagnosis not present

## 2020-10-19 DIAGNOSIS — R809 Proteinuria, unspecified: Secondary | ICD-10-CM | POA: Diagnosis not present

## 2020-10-19 DIAGNOSIS — M9903 Segmental and somatic dysfunction of lumbar region: Secondary | ICD-10-CM | POA: Diagnosis not present

## 2020-10-23 DIAGNOSIS — M9903 Segmental and somatic dysfunction of lumbar region: Secondary | ICD-10-CM | POA: Diagnosis not present

## 2020-10-23 DIAGNOSIS — M5416 Radiculopathy, lumbar region: Secondary | ICD-10-CM | POA: Diagnosis not present

## 2020-10-23 DIAGNOSIS — M545 Low back pain, unspecified: Secondary | ICD-10-CM | POA: Diagnosis not present

## 2020-10-23 DIAGNOSIS — M62830 Muscle spasm of back: Secondary | ICD-10-CM | POA: Diagnosis not present

## 2020-10-26 DIAGNOSIS — M62830 Muscle spasm of back: Secondary | ICD-10-CM | POA: Diagnosis not present

## 2020-10-26 DIAGNOSIS — M9903 Segmental and somatic dysfunction of lumbar region: Secondary | ICD-10-CM | POA: Diagnosis not present

## 2020-10-26 DIAGNOSIS — M5416 Radiculopathy, lumbar region: Secondary | ICD-10-CM | POA: Diagnosis not present

## 2020-10-26 DIAGNOSIS — M545 Low back pain, unspecified: Secondary | ICD-10-CM | POA: Diagnosis not present

## 2020-10-29 DIAGNOSIS — M545 Low back pain, unspecified: Secondary | ICD-10-CM | POA: Diagnosis not present

## 2020-10-29 DIAGNOSIS — M9903 Segmental and somatic dysfunction of lumbar region: Secondary | ICD-10-CM | POA: Diagnosis not present

## 2020-10-29 DIAGNOSIS — M62830 Muscle spasm of back: Secondary | ICD-10-CM | POA: Diagnosis not present

## 2020-10-29 DIAGNOSIS — M5416 Radiculopathy, lumbar region: Secondary | ICD-10-CM | POA: Diagnosis not present

## 2020-11-01 DIAGNOSIS — M9903 Segmental and somatic dysfunction of lumbar region: Secondary | ICD-10-CM | POA: Diagnosis not present

## 2020-11-01 DIAGNOSIS — M62830 Muscle spasm of back: Secondary | ICD-10-CM | POA: Diagnosis not present

## 2020-11-01 DIAGNOSIS — M5416 Radiculopathy, lumbar region: Secondary | ICD-10-CM | POA: Diagnosis not present

## 2020-11-01 DIAGNOSIS — M545 Low back pain, unspecified: Secondary | ICD-10-CM | POA: Diagnosis not present

## 2020-11-14 DIAGNOSIS — M9903 Segmental and somatic dysfunction of lumbar region: Secondary | ICD-10-CM | POA: Diagnosis not present

## 2020-11-14 DIAGNOSIS — M5416 Radiculopathy, lumbar region: Secondary | ICD-10-CM | POA: Diagnosis not present

## 2020-11-14 DIAGNOSIS — M545 Low back pain, unspecified: Secondary | ICD-10-CM | POA: Diagnosis not present

## 2020-11-14 DIAGNOSIS — M62830 Muscle spasm of back: Secondary | ICD-10-CM | POA: Diagnosis not present

## 2020-11-27 IMAGING — CT CT CHEST LUNG CANCER SCREENING LOW DOSE W/O CM
2 of 5 series · 15 of 40 positions shown, 18 images · non-contrast
Comparison: None.

CLINICAL DATA: 63-year-old asymptomatic male former smoker with 30
pack-year smoking history, quit smoking 7 years prior.

EXAM:
CT CHEST WITHOUT CONTRAST LOW-DOSE FOR LUNG CANCER SCREENING
TECHNIQUE: Multidetector CT imaging of the chest was performed following the
standard protocol without IV contrast.

[Series 4: lung 1.00 br44 cor · coronal · 0.65mm/px · 3 of 346 slices shown]
[im 70/346  lung]
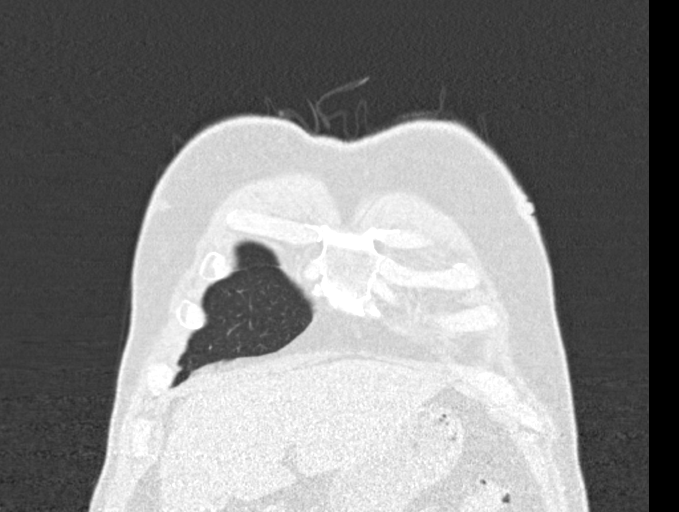
[im 139/346  lung]
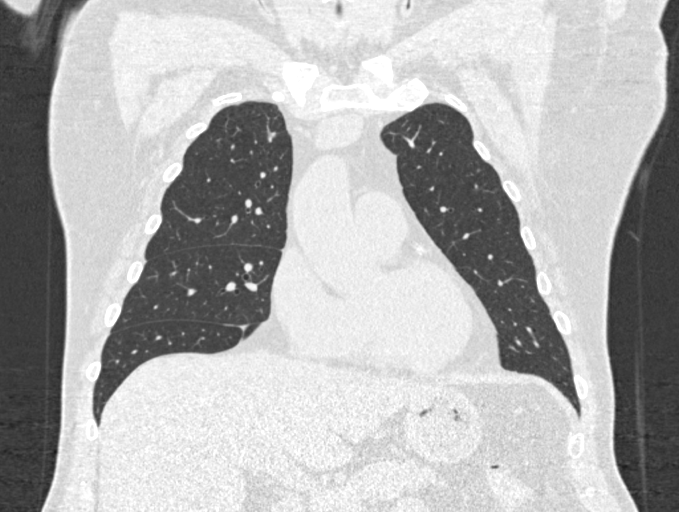
[im 208/346  lung]
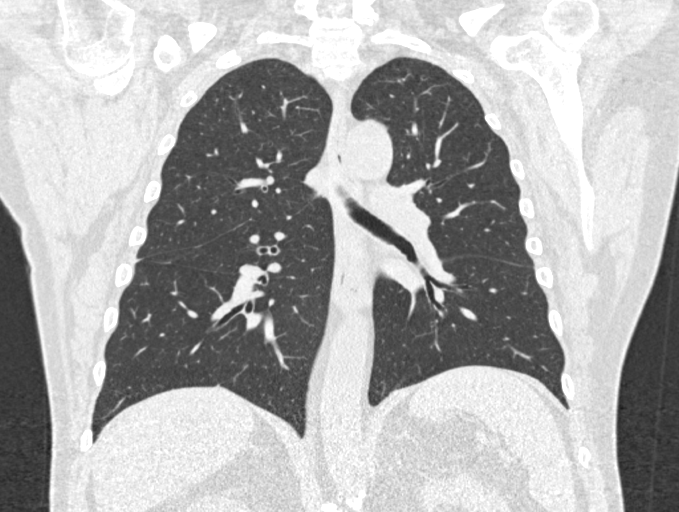

[Series 9: lung 1.00 br60 axial · axial · 0.86mm/px · z∈[-1092,-792]mm · 12 of 332 slices shown, 15 images]
[im 16/332  mediastinal]
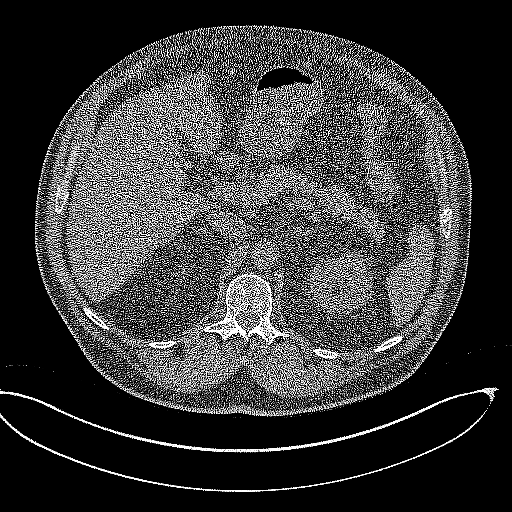
[im 16/332  lung]
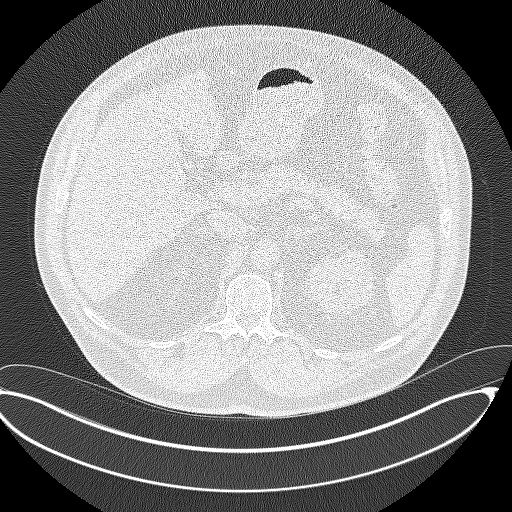
[im 46/332  lung]
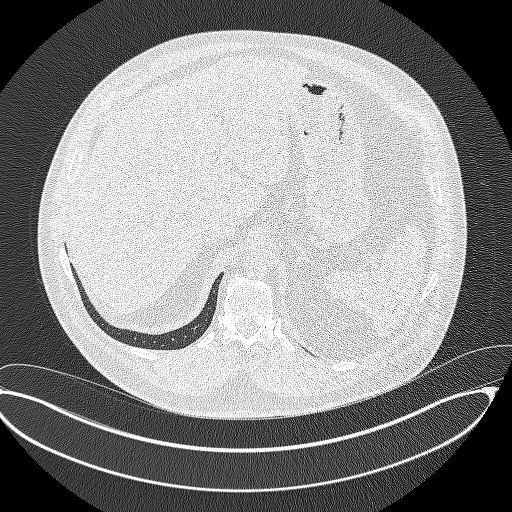
[im 76/332  lung]
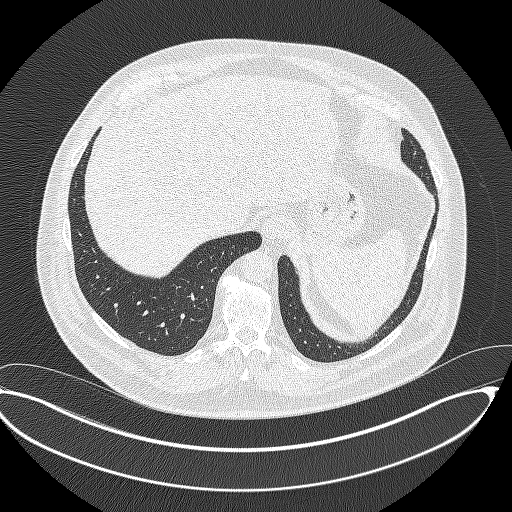
[im 106/332  lung]
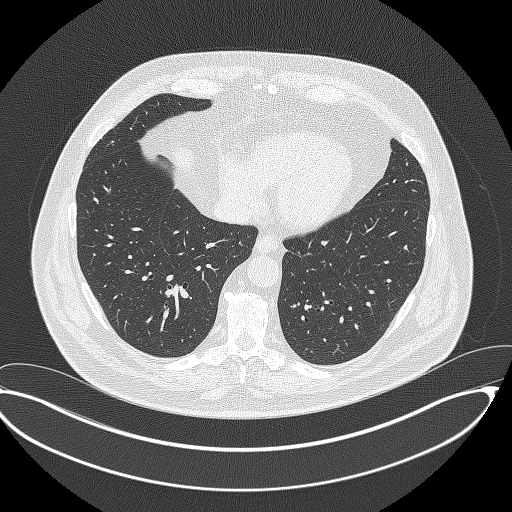
[im 121/332  mediastinal]
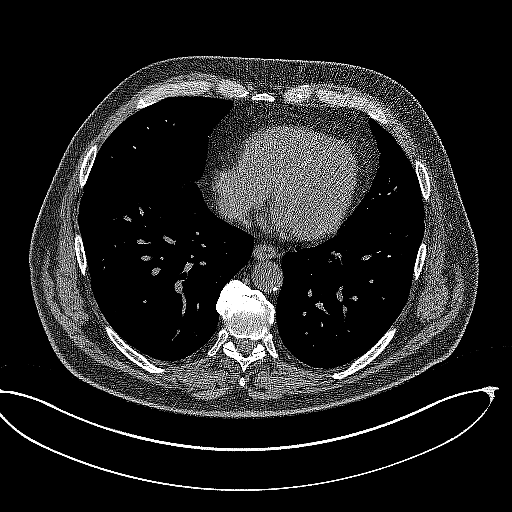
[im 121/332  lung]
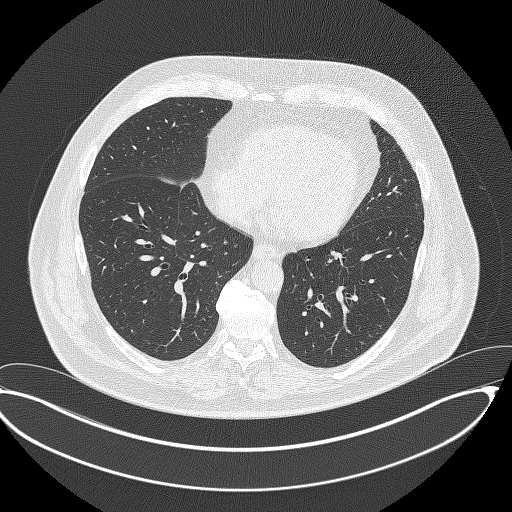
[im 151/332  lung]
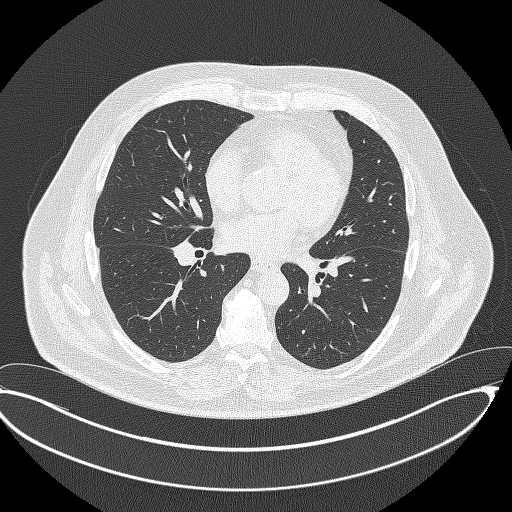
[im 181/332  lung]
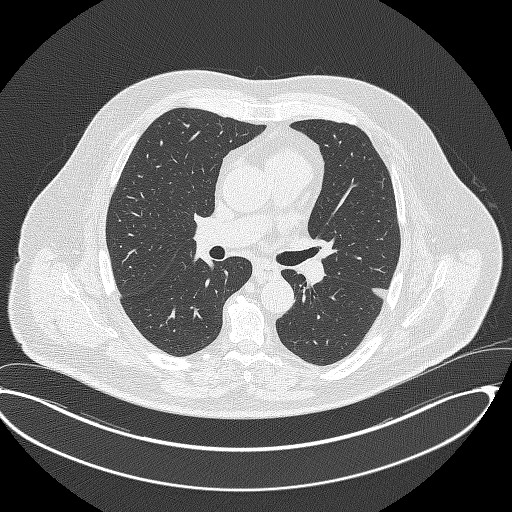
[im 211/332  lung]
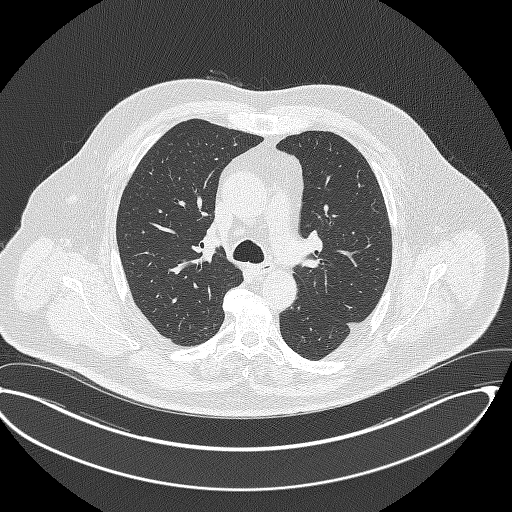
[im 226/332  mediastinal]
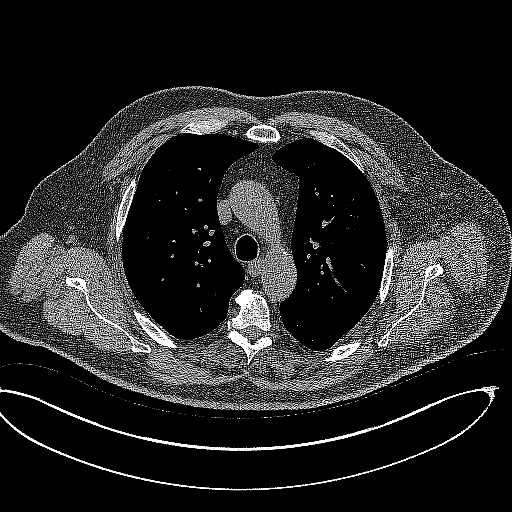
[im 226/332  lung]
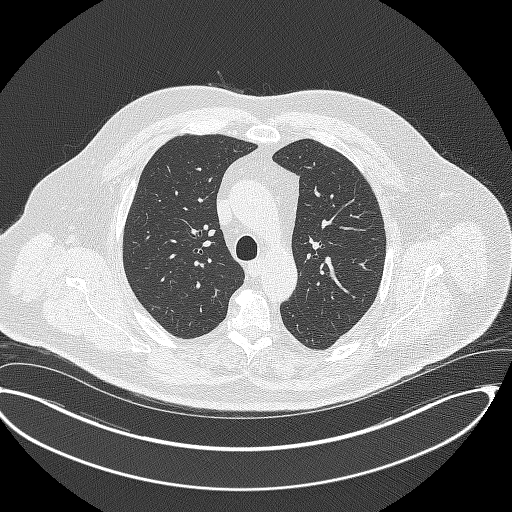
[im 256/332  lung]
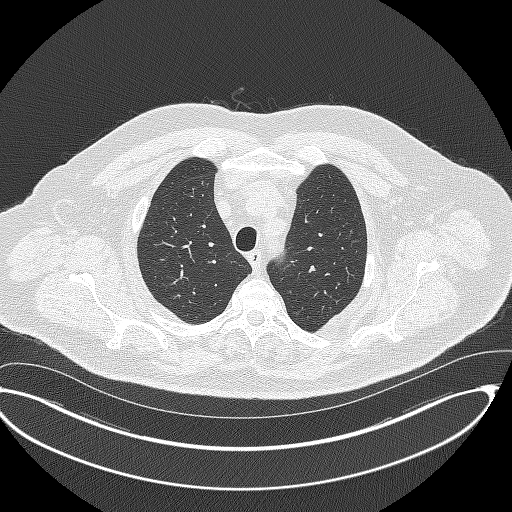
[im 286/332  lung]
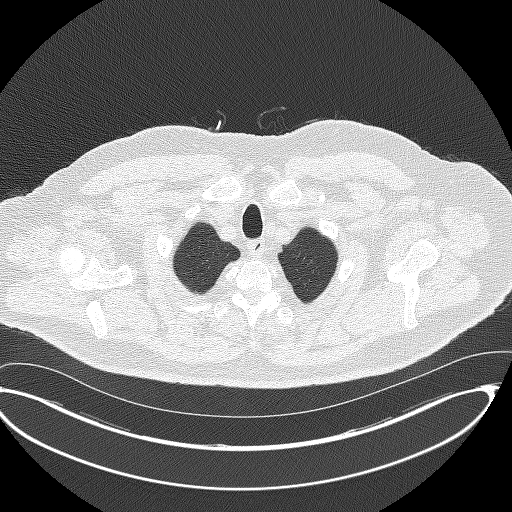
[im 316/332  lung]
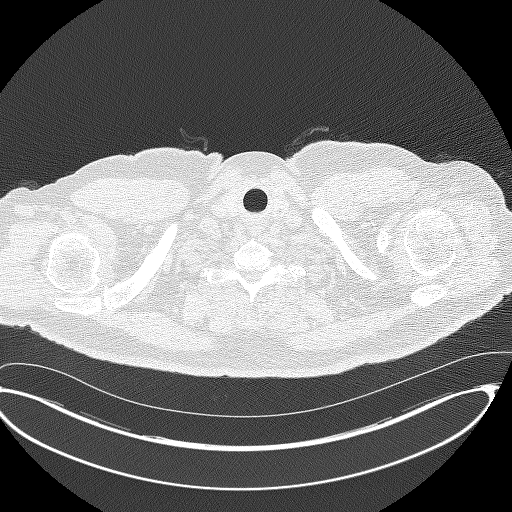

[15 of 40 positions shown; findings below may reference images not displayed]

FINDINGS: Cardiovascular: Normal heart size. No significant pericardial
effusion/thickening. Three-vessel coronary atherosclerosis.
Atherosclerotic thoracic aorta with ectatic 4.0 cm ascending
thoracic aorta. Top-normal caliber main pulmonary artery (3.1 cm
diameter).

Mediastinum/Nodes: Subcentimeter hypodense left thyroid nodule. Not
clinically significant; no follow-up imaging recommended (ref: [HOSPITAL]. [DATE]): 143-50). Unremarkable esophagus. No
pathologically enlarged axillary, mediastinal or hilar lymph nodes,
noting limited sensitivity for the detection of hilar adenopathy on
this noncontrast study.

Lungs/Pleura: No pneumothorax. No pleural effusion. Mild
centrilobular emphysema. No acute consolidative airspace disease or
lung masses. Two scattered tiny solid pulmonary nodules, largest
mm in volume derived mean diameter in the medial left upper lobe
(series 9/image 99).

Upper abdomen: No acute abnormality.

Musculoskeletal: No aggressive appearing focal osseous lesions.
Moderate thoracic spondylosis.
IMPRESSION: 1. Lung-RADS 2, benign appearance or behavior. Continue annual
screening with low-dose chest CT without contrast in 12 months.
2. Three-vessel coronary atherosclerosis.
3. Ectatic 4.0 cm ascending thoracic aorta, which can be reassessed
on follow-up screening chest CT in 12 months.
4. Aortic Atherosclerosis (6H8Y0-S3N.N) and Emphysema (6H8Y0-NV1.8).

## 2020-12-20 ENCOUNTER — Other Ambulatory Visit: Payer: Self-pay | Admitting: Cardiovascular Disease

## 2021-01-16 ENCOUNTER — Other Ambulatory Visit: Payer: Self-pay | Admitting: Family Medicine

## 2021-01-16 DIAGNOSIS — Z122 Encounter for screening for malignant neoplasm of respiratory organs: Secondary | ICD-10-CM

## 2021-03-22 ENCOUNTER — Other Ambulatory Visit: Payer: Self-pay | Admitting: Cardiovascular Disease

## 2021-05-23 DIAGNOSIS — E785 Hyperlipidemia, unspecified: Secondary | ICD-10-CM | POA: Diagnosis not present

## 2021-05-23 DIAGNOSIS — I10 Essential (primary) hypertension: Secondary | ICD-10-CM | POA: Diagnosis not present

## 2021-05-23 DIAGNOSIS — Z23 Encounter for immunization: Secondary | ICD-10-CM | POA: Diagnosis not present

## 2021-05-23 DIAGNOSIS — E1169 Type 2 diabetes mellitus with other specified complication: Secondary | ICD-10-CM | POA: Diagnosis not present

## 2021-06-25 ENCOUNTER — Other Ambulatory Visit: Payer: Self-pay | Admitting: Cardiovascular Disease

## 2021-06-26 DIAGNOSIS — R809 Proteinuria, unspecified: Secondary | ICD-10-CM | POA: Diagnosis not present

## 2021-06-26 DIAGNOSIS — I129 Hypertensive chronic kidney disease with stage 1 through stage 4 chronic kidney disease, or unspecified chronic kidney disease: Secondary | ICD-10-CM | POA: Diagnosis not present

## 2021-08-19 ENCOUNTER — Telehealth: Payer: Self-pay

## 2021-08-19 NOTE — Telephone Encounter (Signed)
**Note De-Identified Lilith Solana Obfuscation** Saman Dueitt Key: [id number] ?Outcome: Available without authorization. ?Drug: Ezetimibe 10MG  tablets  ?Form: PA Form [phone] NCPDP) ? ?[facility] [address] (Ph: [place]) is aware of this outcome. ?

## 2021-08-19 NOTE — Telephone Encounter (Signed)
Received notification of renewal for Ezetimibe 10 mg.  ?I see on file it was to fun out 08/2021.  ? ?Information from covermymeds is below:  ? ?Key: [id number] ?Last name: Eline ?DOB: 1956-04-29 ? ? ?Thank you!  ? ?

## 2021-08-21 ENCOUNTER — Other Ambulatory Visit: Payer: Self-pay | Admitting: Cardiovascular Disease

## 2021-09-05 ENCOUNTER — Other Ambulatory Visit: Payer: Self-pay | Admitting: Cardiovascular Disease

## 2021-10-23 DIAGNOSIS — E119 Type 2 diabetes mellitus without complications: Secondary | ICD-10-CM | POA: Diagnosis not present

## 2021-10-23 DIAGNOSIS — I129 Hypertensive chronic kidney disease with stage 1 through stage 4 chronic kidney disease, or unspecified chronic kidney disease: Secondary | ICD-10-CM | POA: Diagnosis not present

## 2021-10-24 DIAGNOSIS — I129 Hypertensive chronic kidney disease with stage 1 through stage 4 chronic kidney disease, or unspecified chronic kidney disease: Secondary | ICD-10-CM | POA: Diagnosis not present

## 2022-01-28 DIAGNOSIS — R809 Proteinuria, unspecified: Secondary | ICD-10-CM | POA: Diagnosis not present

## 2022-01-28 DIAGNOSIS — N182 Chronic kidney disease, stage 2 (mild): Secondary | ICD-10-CM | POA: Diagnosis not present

## 2022-01-28 DIAGNOSIS — E119 Type 2 diabetes mellitus without complications: Secondary | ICD-10-CM | POA: Diagnosis not present

## 2022-01-28 DIAGNOSIS — I129 Hypertensive chronic kidney disease with stage 1 through stage 4 chronic kidney disease, or unspecified chronic kidney disease: Secondary | ICD-10-CM | POA: Diagnosis not present

## 2022-02-06 ENCOUNTER — Other Ambulatory Visit: Payer: Self-pay | Admitting: Cardiovascular Disease

## 2022-02-06 DIAGNOSIS — Z23 Encounter for immunization: Secondary | ICD-10-CM | POA: Diagnosis not present

## 2022-02-06 DIAGNOSIS — E785 Hyperlipidemia, unspecified: Secondary | ICD-10-CM | POA: Diagnosis not present

## 2022-02-06 DIAGNOSIS — I25810 Atherosclerosis of coronary artery bypass graft(s) without angina pectoris: Secondary | ICD-10-CM | POA: Diagnosis not present

## 2022-02-06 DIAGNOSIS — I10 Essential (primary) hypertension: Secondary | ICD-10-CM | POA: Diagnosis not present

## 2022-02-06 DIAGNOSIS — E1169 Type 2 diabetes mellitus with other specified complication: Secondary | ICD-10-CM | POA: Diagnosis not present

## 2022-03-18 DIAGNOSIS — M170 Bilateral primary osteoarthritis of knee: Secondary | ICD-10-CM | POA: Diagnosis not present

## 2022-03-18 DIAGNOSIS — M1711 Unilateral primary osteoarthritis, right knee: Secondary | ICD-10-CM | POA: Diagnosis not present

## 2022-03-18 DIAGNOSIS — M1712 Unilateral primary osteoarthritis, left knee: Secondary | ICD-10-CM | POA: Diagnosis not present

## 2022-06-23 DIAGNOSIS — G4733 Obstructive sleep apnea (adult) (pediatric): Secondary | ICD-10-CM | POA: Diagnosis not present

## 2022-06-23 DIAGNOSIS — R0683 Snoring: Secondary | ICD-10-CM | POA: Diagnosis not present

## 2022-06-23 DIAGNOSIS — G4710 Hypersomnia, unspecified: Secondary | ICD-10-CM | POA: Diagnosis not present

## 2022-06-30 DIAGNOSIS — E119 Type 2 diabetes mellitus without complications: Secondary | ICD-10-CM | POA: Diagnosis not present

## 2022-06-30 DIAGNOSIS — H25813 Combined forms of age-related cataract, bilateral: Secondary | ICD-10-CM | POA: Diagnosis not present

## 2022-07-14 DIAGNOSIS — Z87891 Personal history of nicotine dependence: Secondary | ICD-10-CM | POA: Diagnosis not present

## 2022-07-14 DIAGNOSIS — E785 Hyperlipidemia, unspecified: Secondary | ICD-10-CM | POA: Diagnosis not present

## 2022-07-14 DIAGNOSIS — H2512 Age-related nuclear cataract, left eye: Secondary | ICD-10-CM | POA: Diagnosis not present

## 2022-07-14 DIAGNOSIS — I10 Essential (primary) hypertension: Secondary | ICD-10-CM | POA: Diagnosis not present

## 2022-07-14 DIAGNOSIS — E1136 Type 2 diabetes mellitus with diabetic cataract: Secondary | ICD-10-CM | POA: Diagnosis not present

## 2022-08-07 DIAGNOSIS — E1169 Type 2 diabetes mellitus with other specified complication: Secondary | ICD-10-CM | POA: Diagnosis not present

## 2022-08-07 DIAGNOSIS — I129 Hypertensive chronic kidney disease with stage 1 through stage 4 chronic kidney disease, or unspecified chronic kidney disease: Secondary | ICD-10-CM | POA: Diagnosis not present

## 2022-08-07 DIAGNOSIS — Z125 Encounter for screening for malignant neoplasm of prostate: Secondary | ICD-10-CM | POA: Diagnosis not present

## 2022-08-07 DIAGNOSIS — E785 Hyperlipidemia, unspecified: Secondary | ICD-10-CM | POA: Diagnosis not present

## 2022-08-08 DIAGNOSIS — E1122 Type 2 diabetes mellitus with diabetic chronic kidney disease: Secondary | ICD-10-CM | POA: Diagnosis not present

## 2022-08-08 DIAGNOSIS — I129 Hypertensive chronic kidney disease with stage 1 through stage 4 chronic kidney disease, or unspecified chronic kidney disease: Secondary | ICD-10-CM | POA: Diagnosis not present

## 2022-08-08 DIAGNOSIS — R809 Proteinuria, unspecified: Secondary | ICD-10-CM | POA: Diagnosis not present

## 2022-08-08 DIAGNOSIS — N182 Chronic kidney disease, stage 2 (mild): Secondary | ICD-10-CM | POA: Diagnosis not present

## 2022-09-15 DIAGNOSIS — M25511 Pain in right shoulder: Secondary | ICD-10-CM | POA: Diagnosis not present

## 2022-11-10 DIAGNOSIS — M545 Low back pain, unspecified: Secondary | ICD-10-CM | POA: Diagnosis not present

## 2022-11-10 DIAGNOSIS — E119 Type 2 diabetes mellitus without complications: Secondary | ICD-10-CM | POA: Diagnosis not present

## 2023-02-09 DIAGNOSIS — N182 Chronic kidney disease, stage 2 (mild): Secondary | ICD-10-CM | POA: Diagnosis not present

## 2023-02-09 DIAGNOSIS — E785 Hyperlipidemia, unspecified: Secondary | ICD-10-CM | POA: Diagnosis not present

## 2023-02-09 DIAGNOSIS — E1169 Type 2 diabetes mellitus with other specified complication: Secondary | ICD-10-CM | POA: Diagnosis not present

## 2023-02-09 DIAGNOSIS — I129 Hypertensive chronic kidney disease with stage 1 through stage 4 chronic kidney disease, or unspecified chronic kidney disease: Secondary | ICD-10-CM | POA: Diagnosis not present

## 2023-02-09 DIAGNOSIS — D696 Thrombocytopenia, unspecified: Secondary | ICD-10-CM | POA: Diagnosis not present

## 2023-02-13 ENCOUNTER — Encounter: Payer: Self-pay | Admitting: Acute Care

## 2023-02-19 DIAGNOSIS — I129 Hypertensive chronic kidney disease with stage 1 through stage 4 chronic kidney disease, or unspecified chronic kidney disease: Secondary | ICD-10-CM | POA: Diagnosis not present

## 2023-02-19 DIAGNOSIS — N182 Chronic kidney disease, stage 2 (mild): Secondary | ICD-10-CM | POA: Diagnosis not present

## 2023-02-19 DIAGNOSIS — R809 Proteinuria, unspecified: Secondary | ICD-10-CM | POA: Diagnosis not present

## 2023-02-19 DIAGNOSIS — E1122 Type 2 diabetes mellitus with diabetic chronic kidney disease: Secondary | ICD-10-CM | POA: Diagnosis not present

## 2023-04-10 ENCOUNTER — Other Ambulatory Visit: Payer: Self-pay | Admitting: Emergency Medicine

## 2023-04-10 DIAGNOSIS — Z122 Encounter for screening for malignant neoplasm of respiratory organs: Secondary | ICD-10-CM

## 2023-04-10 DIAGNOSIS — Z87891 Personal history of nicotine dependence: Secondary | ICD-10-CM

## 2023-04-14 ENCOUNTER — Encounter: Payer: Self-pay | Admitting: Acute Care

## 2023-04-14 ENCOUNTER — Ambulatory Visit (INDEPENDENT_AMBULATORY_CARE_PROVIDER_SITE_OTHER): Payer: Medicare Other | Admitting: Acute Care

## 2023-04-14 DIAGNOSIS — Z87891 Personal history of nicotine dependence: Secondary | ICD-10-CM | POA: Diagnosis not present

## 2023-04-14 NOTE — Progress Notes (Signed)
Virtual Visit via Telephone Note  I connected with Delena Serve on 04/14/23 at  2:00 PM EST by telephone and verified that I am speaking with the correct person using two identifiers.  Location: Patient:  At home Provider: [address], [place], [place], [address]    I discussed the limitations, risks, security and privacy concerns of performing an evaluation and management service by telephone and the availability of in person appointments. I also discussed with the patient that there may be a patient responsible charge related to this service. The patient expressed understanding and agreed to proceed.      Shared Decision Making Visit Lung Cancer Screening Program [phone])   Eligibility: Age 67 y.o. Pack Years Smoking History Calculation 37 pack year (# packs/per year x # years smoked) Recent History of coughing up blood  no Unexplained weight loss? no ( >Than 15 pounds within the last 6 months ) Prior History Lung / other cancer no (Diagnosis within the last 5 years already requiring surveillance chest CT Scans). Smoking Status Former Smoker Former Smokers: Years since quit: 12 years  Quit Date: 02/2011  Visit Components: Discussion included one or more decision making aids. yes Discussion included risk/benefits of screening. yes Discussion included potential follow up diagnostic testing for abnormal scans. yes Discussion included meaning and risk of over diagnosis. yes Discussion included meaning and risk of False Positives. yes Discussion included meaning of total radiation exposure. yes  Counseling Included: Importance of adherence to annual lung cancer LDCT screening. yes Impact of comorbidities on ability to participate in the program. yes Ability and willingness to under diagnostic treatment. yes  Smoking Cessation Counseling: Current Smokers:  Discussed importance of smoking cessation. yes Information about tobacco cessation classes and interventions  provided to patient. yes Patient provided with "ticket" for LDCT Scan. yes Symptomatic Patient. no  Counseling NA Diagnosis Code: Tobacco Use Z72.0 Asymptomatic Patient yes  Counseling (Intermediate counseling: > three minutes counseling) [id number] Former Smokers:  Discussed the importance of maintaining cigarette abstinence. yes Diagnosis Code: Personal History of Nicotine Dependence. U98.119 Information about tobacco cessation classes and interventions provided to patient. Yes Patient provided with "ticket" for LDCT Scan. yes Written Order for Lung Cancer Screening with LDCT placed in Epic. Yes (CT Chest Lung Cancer Screening Low Dose W/O CM) [id number] Z12.2-Screening of respiratory organs Z87.891-Personal history of nicotine dependence     Bevelyn Ngo, NP 04/14/2023

## 2023-04-14 NOTE — Patient Instructions (Signed)

## 2023-04-17 ENCOUNTER — Ambulatory Visit
Admission: RE | Admit: 2023-04-17 | Discharge: 2023-04-17 | Disposition: A | Discharge status: Home / Self Care | Payer: Medicare Other | Source: Ambulatory Visit | Attending: Acute Care | Admitting: Acute Care

## 2023-04-17 DIAGNOSIS — Z122 Encounter for screening for malignant neoplasm of respiratory organs: Secondary | ICD-10-CM | POA: Diagnosis not present

## 2023-04-17 DIAGNOSIS — Z87891 Personal history of nicotine dependence: Secondary | ICD-10-CM | POA: Diagnosis not present

## 2023-05-11 ENCOUNTER — Other Ambulatory Visit: Payer: Self-pay

## 2023-05-11 DIAGNOSIS — Z122 Encounter for screening for malignant neoplasm of respiratory organs: Secondary | ICD-10-CM

## 2023-05-11 DIAGNOSIS — Z87891 Personal history of nicotine dependence: Secondary | ICD-10-CM

## 2023-06-03 DIAGNOSIS — H526 Other disorders of refraction: Secondary | ICD-10-CM | POA: Diagnosis not present

## 2023-06-03 DIAGNOSIS — E119 Type 2 diabetes mellitus without complications: Secondary | ICD-10-CM | POA: Diagnosis not present

## 2023-06-22 DIAGNOSIS — K080 Exfoliation of teeth due to systemic causes: Secondary | ICD-10-CM | POA: Diagnosis not present

## 2023-07-14 DIAGNOSIS — I129 Hypertensive chronic kidney disease with stage 1 through stage 4 chronic kidney disease, or unspecified chronic kidney disease: Secondary | ICD-10-CM | POA: Diagnosis not present

## 2023-07-14 DIAGNOSIS — E785 Hyperlipidemia, unspecified: Secondary | ICD-10-CM | POA: Diagnosis not present

## 2023-07-14 DIAGNOSIS — D696 Thrombocytopenia, unspecified: Secondary | ICD-10-CM | POA: Diagnosis not present

## 2023-07-14 DIAGNOSIS — N182 Chronic kidney disease, stage 2 (mild): Secondary | ICD-10-CM | POA: Diagnosis not present

## 2023-07-14 DIAGNOSIS — E1169 Type 2 diabetes mellitus with other specified complication: Secondary | ICD-10-CM | POA: Diagnosis not present

## 2023-07-22 DIAGNOSIS — Z961 Presence of intraocular lens: Secondary | ICD-10-CM | POA: Diagnosis not present

## 2023-07-22 DIAGNOSIS — H2511 Age-related nuclear cataract, right eye: Secondary | ICD-10-CM | POA: Diagnosis not present

## 2023-08-25 DIAGNOSIS — R809 Proteinuria, unspecified: Secondary | ICD-10-CM | POA: Diagnosis not present

## 2023-08-25 DIAGNOSIS — I129 Hypertensive chronic kidney disease with stage 1 through stage 4 chronic kidney disease, or unspecified chronic kidney disease: Secondary | ICD-10-CM | POA: Diagnosis not present

## 2023-08-25 DIAGNOSIS — E1122 Type 2 diabetes mellitus with diabetic chronic kidney disease: Secondary | ICD-10-CM | POA: Diagnosis not present

## 2023-08-25 DIAGNOSIS — N182 Chronic kidney disease, stage 2 (mild): Secondary | ICD-10-CM | POA: Diagnosis not present

## 2023-09-10 DIAGNOSIS — I951 Orthostatic hypotension: Secondary | ICD-10-CM | POA: Diagnosis not present

## 2023-12-16 DIAGNOSIS — K080 Exfoliation of teeth due to systemic causes: Secondary | ICD-10-CM | POA: Diagnosis not present

## 2024-01-14 DIAGNOSIS — E785 Hyperlipidemia, unspecified: Secondary | ICD-10-CM | POA: Diagnosis not present

## 2024-01-14 DIAGNOSIS — E1169 Type 2 diabetes mellitus with other specified complication: Secondary | ICD-10-CM | POA: Diagnosis not present

## 2024-01-14 DIAGNOSIS — I129 Hypertensive chronic kidney disease with stage 1 through stage 4 chronic kidney disease, or unspecified chronic kidney disease: Secondary | ICD-10-CM | POA: Diagnosis not present

## 2024-01-14 DIAGNOSIS — N182 Chronic kidney disease, stage 2 (mild): Secondary | ICD-10-CM | POA: Diagnosis not present

## 2024-01-14 DIAGNOSIS — M109 Gout, unspecified: Secondary | ICD-10-CM | POA: Diagnosis not present

## 2024-01-14 DIAGNOSIS — Z125 Encounter for screening for malignant neoplasm of prostate: Secondary | ICD-10-CM | POA: Diagnosis not present

## 2024-01-14 DIAGNOSIS — D696 Thrombocytopenia, unspecified: Secondary | ICD-10-CM | POA: Diagnosis not present

## 2024-03-23 DIAGNOSIS — N182 Chronic kidney disease, stage 2 (mild): Secondary | ICD-10-CM | POA: Diagnosis not present

## 2024-03-23 DIAGNOSIS — E1122 Type 2 diabetes mellitus with diabetic chronic kidney disease: Secondary | ICD-10-CM | POA: Diagnosis not present

## 2024-03-23 DIAGNOSIS — K080 Exfoliation of teeth due to systemic causes: Secondary | ICD-10-CM | POA: Diagnosis not present

## 2024-03-23 DIAGNOSIS — I129 Hypertensive chronic kidney disease with stage 1 through stage 4 chronic kidney disease, or unspecified chronic kidney disease: Secondary | ICD-10-CM | POA: Diagnosis not present

## 2024-03-23 DIAGNOSIS — E119 Type 2 diabetes mellitus without complications: Secondary | ICD-10-CM | POA: Diagnosis not present

## 2024-03-23 DIAGNOSIS — R809 Proteinuria, unspecified: Secondary | ICD-10-CM | POA: Diagnosis not present

## 2024-03-29 DIAGNOSIS — K080 Exfoliation of teeth due to systemic causes: Secondary | ICD-10-CM | POA: Diagnosis not present

## 2024-04-26 DIAGNOSIS — K080 Exfoliation of teeth due to systemic causes: Secondary | ICD-10-CM | POA: Diagnosis not present

## 2024-05-04 ENCOUNTER — Ambulatory Visit
Admission: RE | Admit: 2024-05-04 | Discharge: 2024-05-04 | Disposition: A | Discharge status: Home / Self Care | Source: Ambulatory Visit | Attending: *Deleted | Admitting: *Deleted

## 2024-05-04 DIAGNOSIS — I2510 Atherosclerotic heart disease of native coronary artery without angina pectoris: Secondary | ICD-10-CM | POA: Insufficient documentation

## 2024-05-04 DIAGNOSIS — Z122 Encounter for screening for malignant neoplasm of respiratory organs: Secondary | ICD-10-CM | POA: Insufficient documentation

## 2024-05-04 DIAGNOSIS — J439 Emphysema, unspecified: Secondary | ICD-10-CM | POA: Diagnosis not present

## 2024-05-04 DIAGNOSIS — I700 Atherosclerosis of aorta: Secondary | ICD-10-CM | POA: Insufficient documentation

## 2024-05-04 DIAGNOSIS — Z87891 Personal history of nicotine dependence: Secondary | ICD-10-CM | POA: Insufficient documentation

## 2024-05-10 ENCOUNTER — Other Ambulatory Visit: Payer: Self-pay | Admitting: Acute Care

## 2024-05-10 DIAGNOSIS — Z122 Encounter for screening for malignant neoplasm of respiratory organs: Secondary | ICD-10-CM

## 2024-05-10 DIAGNOSIS — Z87891 Personal history of nicotine dependence: Secondary | ICD-10-CM
